# Patient Record
Sex: Male | Born: 1975 | Race: Black or African American | Hispanic: No | Marital: Single
Health system: Southern US, Community
[De-identification: ages and names within clinical notes are randomized; demographics above are authoritative.]

---

## 1997-12-29 ENCOUNTER — Encounter: Payer: Self-pay | Admitting: Emergency Medicine

## 1997-12-30 ENCOUNTER — Inpatient Hospital Stay (HOSPITAL_COMMUNITY): Admission: EM | Admit: 1997-12-30 | Discharge: 1997-12-30 | Payer: Self-pay | Admitting: Emergency Medicine

## 1998-03-17 ENCOUNTER — Emergency Department (HOSPITAL_COMMUNITY): Admission: EM | Admit: 1998-03-17 | Discharge: 1998-03-17 | Payer: Self-pay | Admitting: Emergency Medicine

## 1998-03-17 ENCOUNTER — Encounter: Payer: Self-pay | Admitting: Emergency Medicine

## 1998-06-04 ENCOUNTER — Emergency Department (HOSPITAL_COMMUNITY): Admission: EM | Admit: 1998-06-04 | Discharge: 1998-06-04 | Payer: Self-pay | Admitting: Emergency Medicine

## 1998-06-04 ENCOUNTER — Encounter: Payer: Self-pay | Admitting: Emergency Medicine

## 1998-06-06 ENCOUNTER — Emergency Department (HOSPITAL_COMMUNITY): Admission: EM | Admit: 1998-06-06 | Discharge: 1998-06-06 | Payer: Self-pay | Admitting: Emergency Medicine

## 1998-11-25 ENCOUNTER — Inpatient Hospital Stay (HOSPITAL_COMMUNITY): Admission: EM | Admit: 1998-11-25 | Discharge: 1998-11-30 | Payer: Self-pay

## 2002-04-25 ENCOUNTER — Emergency Department (HOSPITAL_COMMUNITY): Admission: EM | Admit: 2002-04-25 | Discharge: 2002-04-25 | Payer: Self-pay | Admitting: Emergency Medicine

## 2002-07-25 ENCOUNTER — Encounter: Payer: Self-pay | Admitting: Emergency Medicine

## 2002-07-25 ENCOUNTER — Emergency Department (HOSPITAL_COMMUNITY): Admission: EM | Admit: 2002-07-25 | Discharge: 2002-07-25 | Payer: Self-pay | Admitting: Emergency Medicine

## 2002-07-30 ENCOUNTER — Encounter: Admission: RE | Admit: 2002-07-30 | Discharge: 2002-07-30 | Payer: Self-pay | Admitting: Orthopedic Surgery

## 2002-07-30 ENCOUNTER — Encounter: Payer: Self-pay | Admitting: Orthopedic Surgery

## 2002-08-14 ENCOUNTER — Ambulatory Visit (HOSPITAL_BASED_OUTPATIENT_CLINIC_OR_DEPARTMENT_OTHER): Admission: RE | Admit: 2002-08-14 | Discharge: 2002-08-15 | Payer: Self-pay | Admitting: Orthopedic Surgery

## 2002-10-05 ENCOUNTER — Encounter: Admission: RE | Admit: 2002-10-05 | Discharge: 2002-12-10 | Payer: Self-pay | Admitting: Orthopedic Surgery

## 2003-05-19 ENCOUNTER — Emergency Department (HOSPITAL_COMMUNITY): Admission: EM | Admit: 2003-05-19 | Discharge: 2003-05-19 | Payer: Self-pay | Admitting: Family Medicine

## 2004-06-08 ENCOUNTER — Emergency Department (HOSPITAL_COMMUNITY): Admission: EM | Admit: 2004-06-08 | Discharge: 2004-06-08 | Payer: Self-pay | Admitting: Emergency Medicine

## 2005-03-15 ENCOUNTER — Emergency Department (HOSPITAL_COMMUNITY): Admission: EM | Admit: 2005-03-15 | Discharge: 2005-03-15 | Payer: Self-pay | Admitting: Family Medicine

## 2008-05-07 ENCOUNTER — Emergency Department (HOSPITAL_COMMUNITY): Admission: EM | Admit: 2008-05-07 | Discharge: 2008-05-07 | Payer: Self-pay | Admitting: Emergency Medicine

## 2010-05-26 NOTE — Op Note (Signed)
NAME:  Dillon Molina, Dillon Molina                   ACCOUNT NO.:  [id number]   MEDICAL RECORD NO.:  [id number]                   PATIENT TYPE:  AMB   LOCATION:  [facility]SC                                  FACILITY:  [facility]   PHYSICIAN:  John L. Rendall III, M.D.           DATE OF BIRTH:  1975/06/05   DATE OF PROCEDURE:  08/14/2002  DATE OF DISCHARGE:                                 OPERATIVE REPORT   PREOPERATIVE DIAGNOSIS:  Displaced tibial spine avulsion, left knee.   POSTOPERATIVE DIAGNOSIS:  Displaced tibial spine avulsion, left knee.   OPERATIVE PROCEDURE:  Arthroscopically aided reduction of tibial spine  avulsion with screw fixation through small arthrotomy.   SURGEON:  John L. Rendall, M.D.   ASSISTANT:  Legrand Pitts. Duffy, P.A.   ANESTHESIA:  General.   PATHOLOGY:  The patient has avulsed approximately a 1 inch by 1 inch piece  of tibial spine from the center of the tibia with some small fragmentation  posteriorly.  The fragment is elevated 1.5 cm in this mature 35 year old.  At the time of surgery, it is approximately 2 1/2 weeks post injury.   PROCEDURE:  Under general anesthesia, the left leg is prepared with Betadine  and draped as a sterile field.  It is wrapped with the Esmarch and the  tourniquet was used to 350 mmHg.  Standard arthroscopy portals are made and  diagnostic arthroscopy washes a lot of blood and debris from the knee.  The  menisci are intact.  The tibial spine avulsion is clearly visualized.  Fibrinous tissue around the edges of it are debrided with intra-articular  shaver and then the fragment is elevated and the crater is debrided of  fibrin and clot.  Following this debridement, an attempt is made to push it  back in place but we were finding difficulty from overhanging anterior horns  of the medial and lateral menisci blocking reduction of the fragment.  It is  not possible to push it back in place and keep this tissue out of the way.  At that point, an  anteromedial arthrotomy 1 1/2 to 2 inches in length is  made.  The anterior horns of the menisci were pulled forward.  The fragment  is physically pushed back more easily and reduced, and at this point, the  [employer] arthroscopy guide is used to place a guide-wire in the center of  the tibial spine.  Two such guide-wires were passed and two screws were  placed.  These are 4.5 cancellous lag screws.  C-arm pictures were then  obtained showing reduction of the fragment in satisfactory position and  alignment in two views.  There is what appears to be a small fragment  posterolaterally.  The scope is inserted and, indeed, a fragment is in the  lateral compartment.  This bone fragment is removed and the fixation is felt  to be satisfactory and in good position and alignment on all sides.  Having  checked this, the arthrotomy was closed with 2-0 Vicryl, the retinaculum and  capsule with 0 Vicryl, subcu with 2-0 Vicryl, and the skin with clips.  The  knee is then infiltrated with Marcaine with morphine with epinephrine and a  sterile compression bandage followed by a cylinder cast with the knee flexed  40 degrees is applied.  The patient returned to the recovery room in good  condition.                                               John L. Dorothyann Gibbs, M.D.   Renato Gails  D:  08/14/2002  T:  08/14/2002  Job:  [id number]
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
## 2017-03-13 ENCOUNTER — Encounter (HOSPITAL_COMMUNITY): Payer: Self-pay

## 2017-03-13 ENCOUNTER — Emergency Department (HOSPITAL_COMMUNITY)
Admission: EM | Admit: 2017-03-13 | Discharge: 2017-03-14 | Disposition: A | Discharge status: Home / Self Care | Payer: Self-pay | Attending: Emergency Medicine | Admitting: Emergency Medicine

## 2017-03-13 DIAGNOSIS — H6121 Impacted cerumen, right ear: Secondary | ICD-10-CM | POA: Insufficient documentation

## 2017-03-13 DIAGNOSIS — K529 Noninfective gastroenteritis and colitis, unspecified: Secondary | ICD-10-CM | POA: Insufficient documentation

## 2017-03-13 DIAGNOSIS — H6691 Otitis media, unspecified, right ear: Secondary | ICD-10-CM | POA: Insufficient documentation

## 2017-03-13 LAB — COMPREHENSIVE METABOLIC PANEL
ALK PHOS: 60 U/L (ref 38–126)
ALT: 16 U/L — ABNORMAL LOW (ref 17–63)
ANION GAP: 10 (ref 5–15)
AST: 19 U/L (ref 15–41)
Albumin: 4.2 g/dL (ref 3.5–5.0)
BUN: 13 mg/dL (ref 6–20)
CALCIUM: 9.1 mg/dL (ref 8.9–10.3)
CO2: 25 mmol/L (ref 22–32)
CREATININE: 1.08 mg/dL (ref 0.61–1.24)
Chloride: 102 mmol/L (ref 101–111)
Glucose, Bld: 98 mg/dL (ref 65–99)
Potassium: 4.2 mmol/L (ref 3.5–5.1)
SODIUM: 137 mmol/L (ref 135–145)
TOTAL PROTEIN: 7.5 g/dL (ref 6.5–8.1)
Total Bilirubin: 0.9 mg/dL (ref 0.3–1.2)

## 2017-03-13 LAB — CBC
HCT: 47.9 % (ref 39.0–52.0)
HEMOGLOBIN: 16.3 g/dL (ref 13.0–17.0)
MCH: 32.3 pg (ref 26.0–34.0)
MCHC: 34 g/dL (ref 30.0–36.0)
MCV: 95 fL (ref 78.0–100.0)
PLATELETS: 335 10*3/uL (ref 150–400)
RBC: 5.04 MIL/uL (ref 4.22–5.81)
RDW: 12.5 % (ref 11.5–15.5)
WBC: 9.1 10*3/uL (ref 4.0–10.5)

## 2017-03-13 LAB — LIPASE, BLOOD: Lipase: 26 U/L (ref 11–51)

## 2017-03-13 MED ORDER — ACETAMINOPHEN 325 MG PO TABS
650.0000 mg | ORAL_TABLET | Freq: Once | ORAL | Status: AC | PRN
  Administered 2017-03-13: 650 mg via ORAL
  Filled 2017-03-13: qty 2

## 2017-03-13 NOTE — ED Triage Notes (Signed)
Pt c/o N/V/D, back pain, right ear pain since this morning.

## 2017-03-13 NOTE — ED Notes (Signed)
Called for triage and room placement with no answer x1.

## 2017-03-13 NOTE — ED Notes (Signed)
Called for triage and room placement x2 with no answer.

## 2017-03-14 ENCOUNTER — Emergency Department (HOSPITAL_COMMUNITY): Payer: Self-pay

## 2017-03-14 LAB — URINALYSIS, ROUTINE W REFLEX MICROSCOPIC
Bilirubin Urine: NEGATIVE
GLUCOSE, UA: NEGATIVE mg/dL
HGB URINE DIPSTICK: NEGATIVE
KETONES UR: 5 mg/dL — AB
Leukocytes, UA: NEGATIVE
Nitrite: NEGATIVE
PROTEIN: NEGATIVE mg/dL
Specific Gravity, Urine: 1.034 — ABNORMAL HIGH (ref 1.005–1.030)
pH: 5 (ref 5.0–8.0)

## 2017-03-14 LAB — INFLUENZA PANEL BY PCR (TYPE A & B)
Influenza A By PCR: NEGATIVE
Influenza B By PCR: NEGATIVE

## 2017-03-14 MED ORDER — CARBAMIDE PEROXIDE 6.5 % OT SOLN: 5.0000 [drp] | Freq: Two times a day (BID) | OTIC | 0 refills | Status: AC

## 2017-03-14 MED ORDER — ONDANSETRON HCL 4 MG/2ML IJ SOLN
4.0000 mg | Freq: Once | INTRAMUSCULAR | Status: AC
  Administered 2017-03-14: 4 mg via INTRAVENOUS
  Filled 2017-03-14: qty 2

## 2017-03-14 MED ORDER — METOCLOPRAMIDE HCL 10 MG PO TABS: 10.0000 mg | ORAL_TABLET | Freq: Four times a day (QID) | ORAL | 0 refills | Status: AC | PRN

## 2017-03-14 MED ORDER — KETOROLAC TROMETHAMINE 30 MG/ML IJ SOLN
30.0000 mg | Freq: Once | INTRAMUSCULAR | Status: AC
  Administered 2017-03-14: 30 mg via INTRAVENOUS
  Filled 2017-03-14: qty 1

## 2017-03-14 MED ORDER — SODIUM CHLORIDE 0.9 % IV BOLUS (SEPSIS)
1000.0000 mL | Freq: Once | INTRAVENOUS | Status: AC
  Administered 2017-03-14: 1000 mL via INTRAVENOUS

## 2017-03-14 MED ORDER — AMOXICILLIN-POT CLAVULANATE 875-125 MG PO TABS
1.0000 | ORAL_TABLET | Freq: Once | ORAL | Status: AC
  Administered 2017-03-14: 1 via ORAL
  Filled 2017-03-14: qty 1

## 2017-03-14 MED ORDER — AMOXICILLIN-POT CLAVULANATE 875-125 MG PO TABS: 1.0000 | ORAL_TABLET | Freq: Two times a day (BID) | ORAL | 0 refills | Status: AC

## 2017-03-14 NOTE — Discharge Instructions (Signed)
Stay hydrated.   Take augmentin twice daily for a week for ear infection.   Take reglan for nausea.   Use debrox drops to right ear twice daily for a week for ear wax   See your doctor in a week for follow up   Return to ER if you have fever, worse ear pain or abdominal pain, vomiting, dehydration

## 2017-03-14 NOTE — ED Notes (Signed)
THIS WRITER REQUESTING ASSISTANCE FOR PATIENT CARE. AWAITING RESPONSE.

## 2017-03-14 NOTE — ED Notes (Signed)
ENCOURAGED PT TO KEEP ARM STRAIGHT. NS BOLUS INFUSING WITHOUT DIFFICULTY

## 2017-03-14 NOTE — ED Notes (Signed)
ED Provider at bedside. YAO 

## 2017-03-14 NOTE — ED Triage Notes (Signed)
Pt called for vital signs with no answer 

## 2017-03-14 NOTE — ED Notes (Signed)
Patient sitting up and eating.

## 2017-03-14 NOTE — ED Provider Notes (Signed)
[facility]-EMERGENCY DEPT Provider Note   CSN: [id number] Arrival date & time: 03/13/17  1751     History   Chief Complaint Chief Complaint  Patient presents with  . Emesis  . Diarrhea  . Otalgia    HPI Dillon Molina is a 42 y.o. male here presenting with abdominal pain, chills, ear pain.  Patient states that he had some mild back pain as well as right ear pain and abdominal pain since yesterday.  He was noted to have low-grade temperature 100.5 in triage.  Has some chills and nonproductive cough.  He felt nauseated and had several episodes of diarrhea.  Patient has right ear pain but denies any discharge from the ear.  Patient has no known sick contacts.  The history is provided by the patient.    History reviewed. No pertinent past medical history.  There are no active problems to display for this patient.   History reviewed. No pertinent surgical history.     Home Medications    Prior to Admission medications   Not on File    Family History No family history on file.  Social History Social History   Tobacco Use  . Smoking status: Not on file  Substance Use Topics  . Alcohol use: Not on file  . Drug use: Not on file     Allergies   Patient has no known allergies.   Review of Systems Review of Systems  HENT: Positive for ear pain.   Gastrointestinal: Positive for diarrhea.  All other systems reviewed and are negative.    Physical Exam Updated Vital Signs BP 103/64 (BP Location: Right Arm)   Pulse 73   Temp 98.3 F (36.8 C) (Oral)   Resp 18   Ht 5\' 4"  (1.626 m)   Wt 71.7 kg (158 lb)   SpO2 97%   BMI 27.12 kg/m   Physical Exam  Constitutional: He is oriented to person, place, and time.  Slightly uncomfortable   HENT:  Head: Normocephalic.  MM slightly dry, R cerumen impaction   Eyes: Conjunctivae and EOM are normal. Pupils are equal, round, and reactive to light.  Neck: Normal range of motion. Neck  supple.  Cardiovascular: Normal rate, regular rhythm and normal heart sounds.  Pulmonary/Chest: Effort normal and breath sounds normal. No stridor. No respiratory distress. He has no wheezes.  Abdominal: Soft. Bowel sounds are normal. He exhibits no distension. There is no tenderness. There is no guarding.  Musculoskeletal: Normal range of motion.  Neurological: He is alert and oriented to person, place, and time.  Skin: Skin is warm.  Psychiatric: He has a normal mood and affect.  Nursing note and vitals reviewed.    ED Treatments / Results  Labs (all labs ordered are listed, but only abnormal results are displayed) Labs Reviewed  COMPREHENSIVE METABOLIC PANEL - Abnormal; Notable for the following components:      Result Value   ALT 16 (*)    All other components within normal limits  URINALYSIS, ROUTINE W REFLEX MICROSCOPIC - Abnormal; Notable for the following components:   Specific Gravity, Urine 1.034 (*)    Ketones, ur 5 (*)    All other components within normal limits  LIPASE, BLOOD  CBC  INFLUENZA PANEL BY PCR (TYPE A & B)    EKG  EKG Interpretation None       Radiology Dg Abd Acute W/chest  Result Date: 03/14/2017 CLINICAL DATA:  Abdominal pain, cough EXAM: DG ABDOMEN ACUTE W/  1V CHEST COMPARISON:  None. FINDINGS: There is no evidence of dilated bowel loops or free intraperitoneal air. No radiopaque calculi or other significant radiographic abnormality is seen. Heart size and mediastinal contours are within normal limits. Both lungs are clear. IMPRESSION: Negative abdominal radiographs.  No acute cardiopulmonary disease. Electronically Signed   By: Charlett NoseKevin  Dover M.D.   On: 03/14/2017 09:13    Procedures Procedures (including critical care time)  Ceruminosis is noted.  Wax is removed by syringing and manual debridement. Instructions for home care to prevent wax buildup are given.   Medications Ordered in ED Medications  acetaminophen (TYLENOL) tablet 650 mg  (650 mg Oral Given 03/13/17 1914)  ketorolac (TORADOL) 30 MG/ML injection 30 mg (30 mg Intravenous Given 03/14/17 1048)  sodium chloride 0.9 % bolus 1,000 mL (1,000 mLs Intravenous New Bag/Given 03/14/17 1048)  ondansetron (ZOFRAN) injection 4 mg (4 mg Intravenous Given 03/14/17 1048)  amoxicillin-clavulanate (AUGMENTIN) 875-125 MG per tablet 1 tablet (1 tablet Oral Given 03/14/17 1048)     Initial Impression / Assessment and Plan / ED Course  I have reviewed the triage vital signs and the nursing notes.  Pertinent labs & imaging results that were available during my care of the patient were reviewed by me and considered in my medical decision making (see chart for details).     Dillon Molina is a 42 y.o. male here with R ear pain, abdominal pain, diarrhea. Has R cerumen impaction. I cleaned out the cerumen with curette and there is mild R otitis media. Low grade temp 100.5 in the ED. I suspect gastro vs flu. Will get labs, UA, flu, and give IVF and augmentin and reassess.   1:28 PM Labs unremarkable. UA nl. Flu negative. No vomiting after zofran. Felt better. Likely mild gastro. Will dc home with reglan for nausea and vomiting. Will give augmentin for R otitis media, debrox for ear wax.   Final Clinical Impressions(s) / ED Diagnoses   Final diagnoses:  None    ED Discharge Orders    None       Charlynne PanderYao, David Hsienta, MD 03/14/17 1329

## 2019-06-21 IMAGING — CR DG ABDOMEN ACUTE W/ 1V CHEST
3 series · 3 of 3 positions shown · non-contrast
Comparison: None.

CLINICAL DATA: Abdominal pain, cough

EXAM:
DG ABDOMEN ACUTE W/ 1V CHEST

[w chest pa]
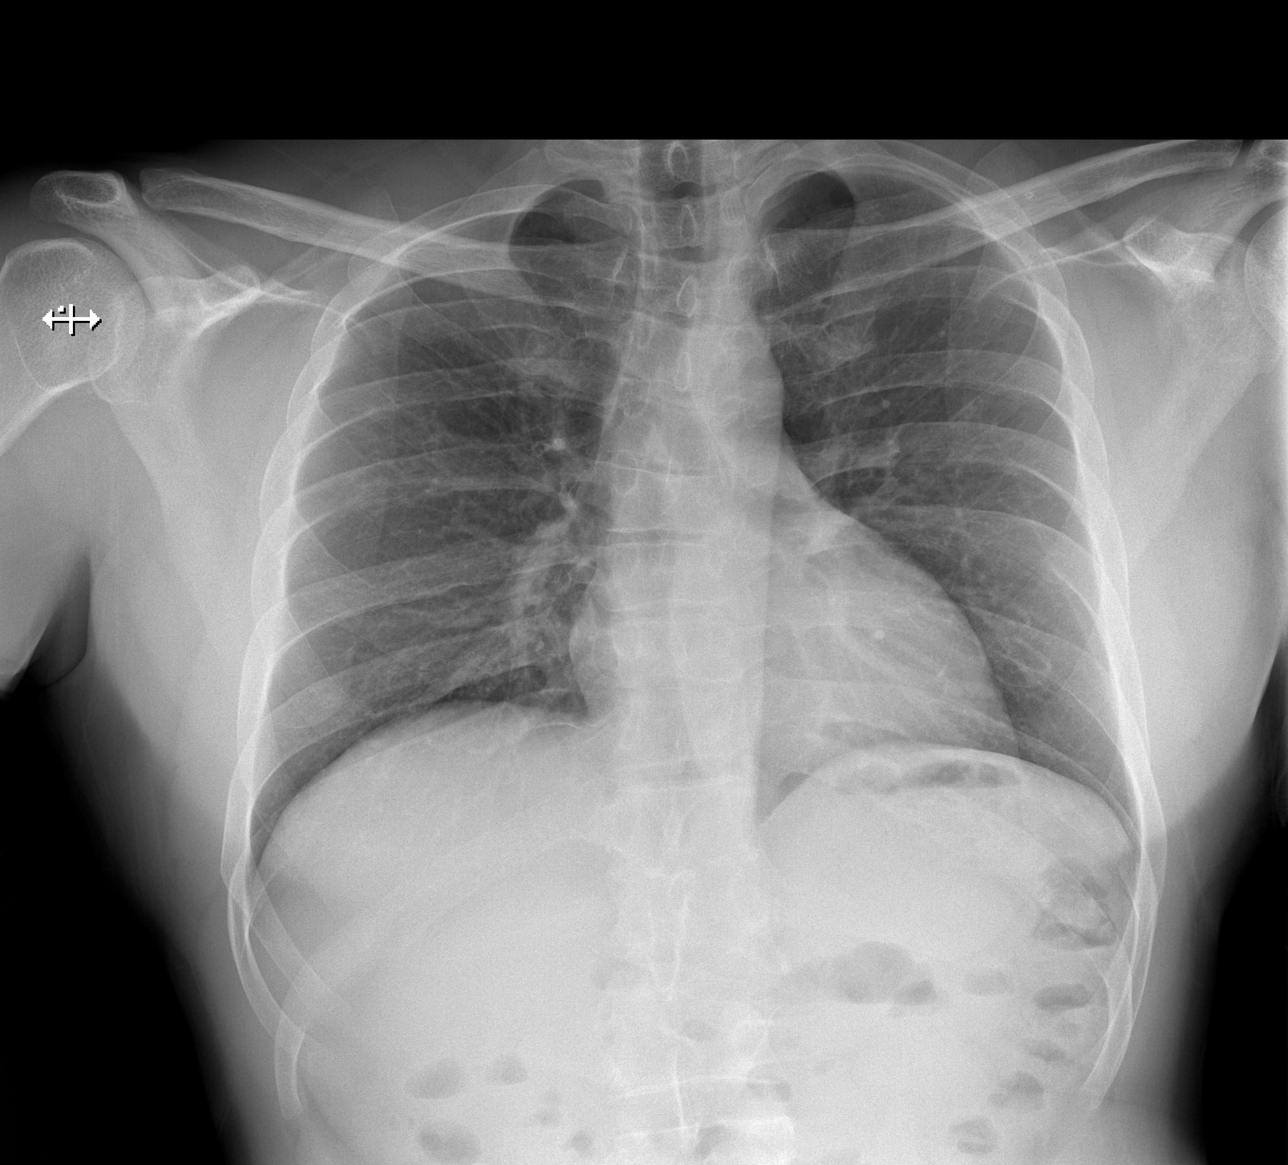

[w abdomen upright]
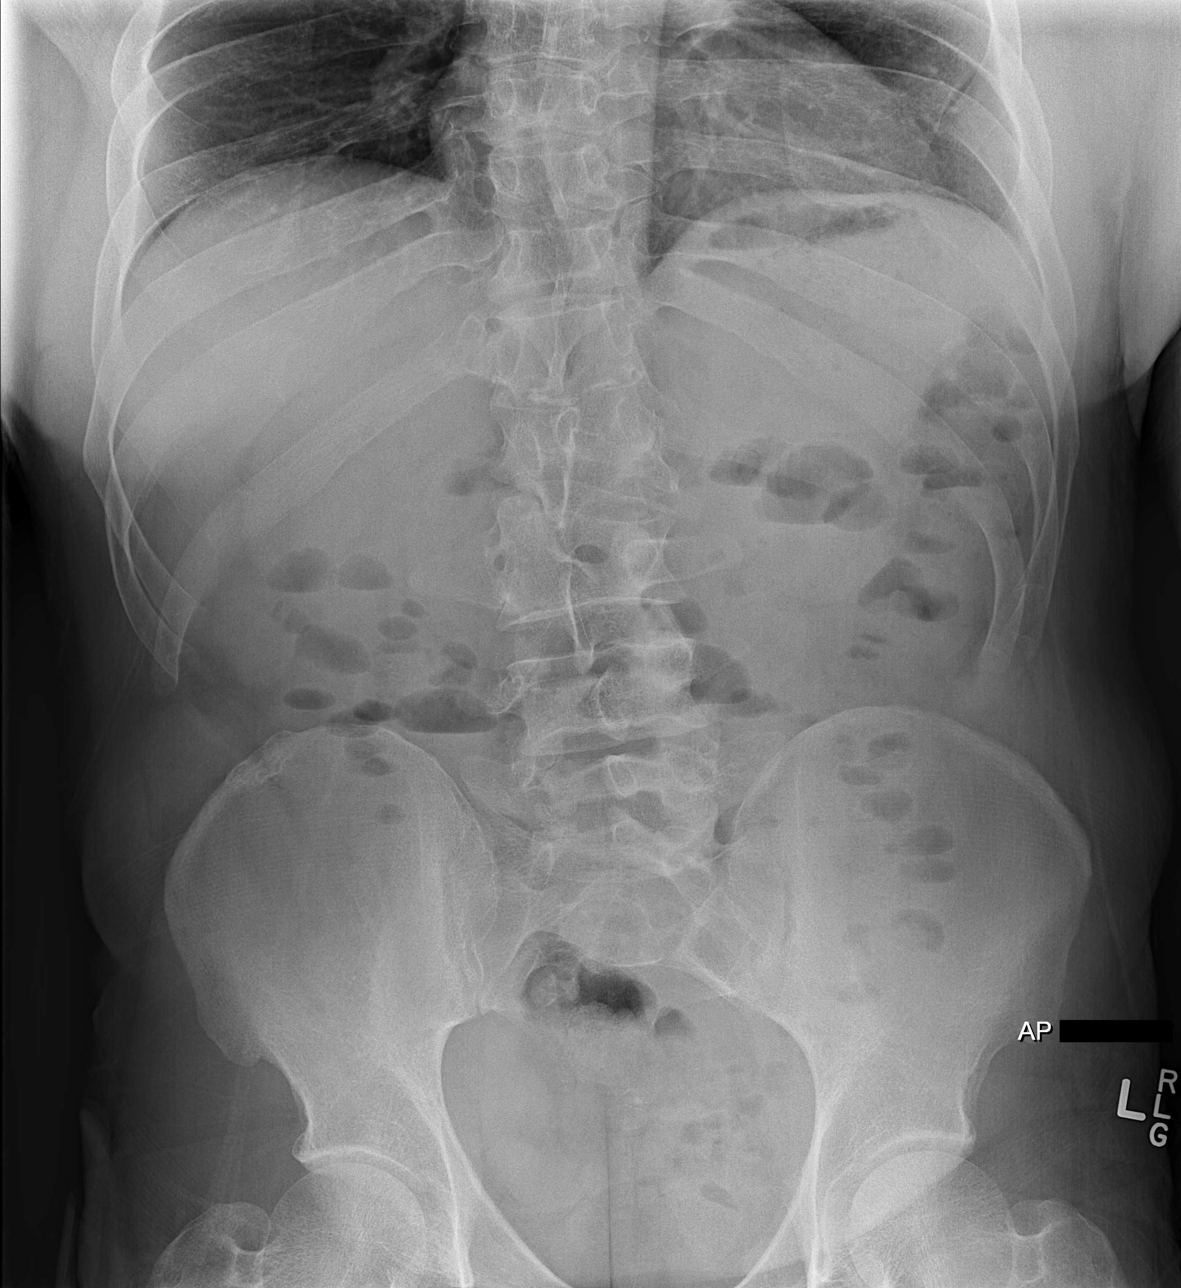

[t abdomen supine]
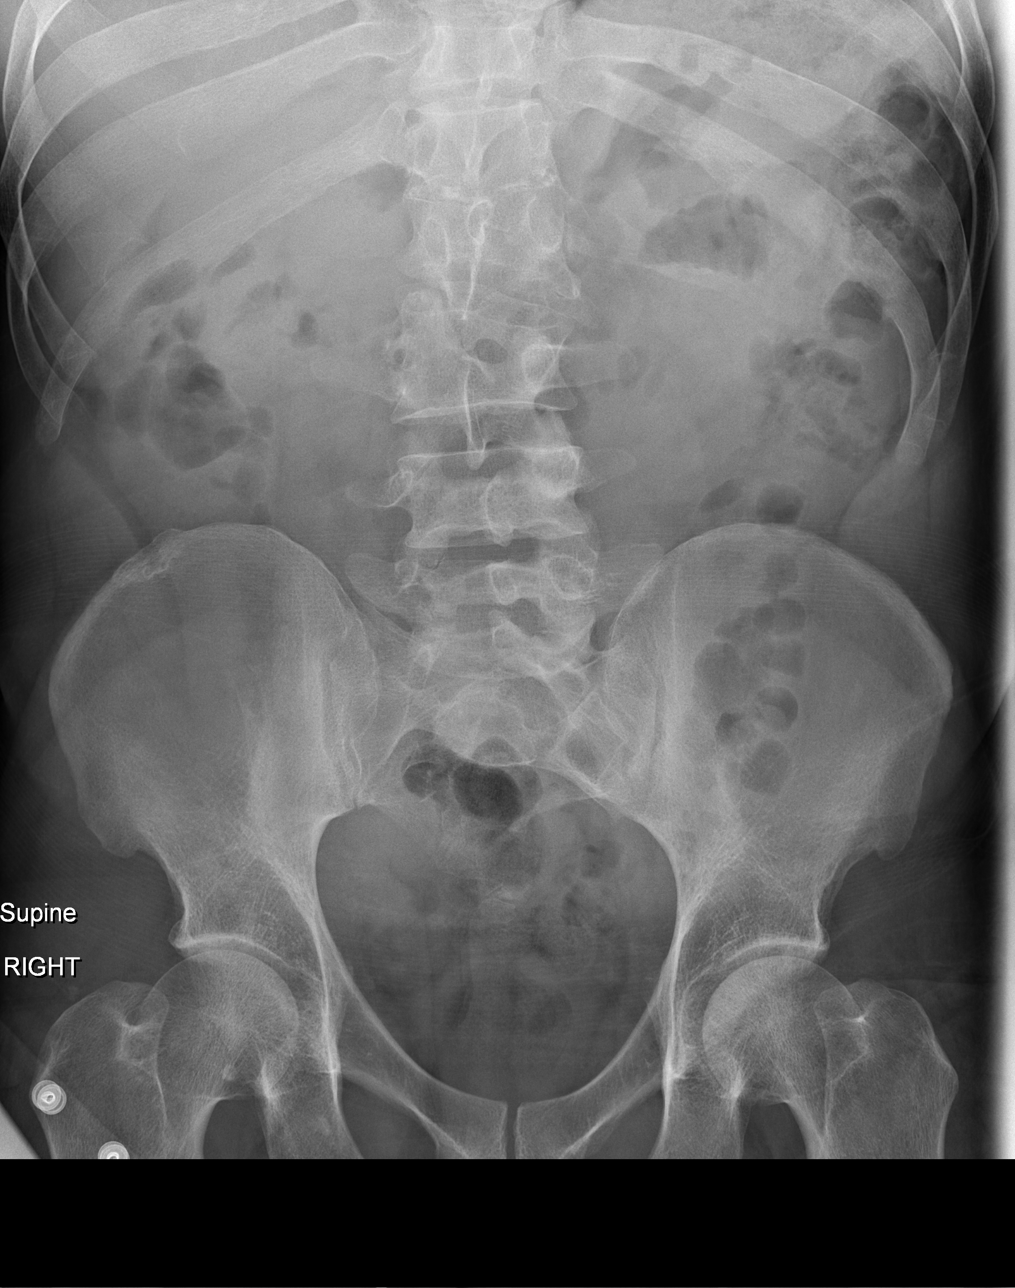

[3 of 3 positions shown; findings below may reference images not displayed]

FINDINGS: There is no evidence of dilated bowel loops or free intraperitoneal
air. No radiopaque calculi or other significant radiographic
abnormality is seen. Heart size and mediastinal contours are within
normal limits. Both lungs are clear.
IMPRESSION: Negative abdominal radiographs.  No acute cardiopulmonary disease.
